# Patient Record
Sex: Female | Born: 1945 | Race: White | Hispanic: No | Marital: Married | Smoking: Former smoker
Health system: Southern US, Community
[De-identification: ages and names within clinical notes are randomized; demographics above are authoritative.]

## PROBLEM LIST (undated history)

## (undated) DIAGNOSIS — I10 Essential (primary) hypertension: Secondary | ICD-10-CM

## (undated) HISTORY — PX: BREAST BIOPSY: SHX20

## (undated) HISTORY — PX: KNEE SURGERY: SHX244

## (undated) HISTORY — DX: Essential (primary) hypertension: I10

---

## 1999-08-31 ENCOUNTER — Emergency Department (HOSPITAL_COMMUNITY): Admission: EM | Admit: 1999-08-31 | Discharge: 1999-08-31 | Payer: Self-pay | Admitting: Emergency Medicine

## 1999-08-31 ENCOUNTER — Encounter: Payer: Self-pay | Admitting: Emergency Medicine

## 1999-09-04 ENCOUNTER — Other Ambulatory Visit: Admission: RE | Admit: 1999-09-04 | Discharge: 1999-09-04 | Payer: Self-pay | Admitting: Gynecology

## 1999-09-04 ENCOUNTER — Encounter (INDEPENDENT_AMBULATORY_CARE_PROVIDER_SITE_OTHER): Payer: Self-pay | Admitting: *Deleted

## 2000-04-21 ENCOUNTER — Encounter: Admission: RE | Admit: 2000-04-21 | Discharge: 2000-04-21 | Payer: Self-pay | Admitting: Gynecology

## 2000-04-21 ENCOUNTER — Encounter: Payer: Self-pay | Admitting: Gynecology

## 2001-05-02 ENCOUNTER — Encounter: Payer: Self-pay | Admitting: Gynecology

## 2001-05-02 ENCOUNTER — Encounter: Admission: RE | Admit: 2001-05-02 | Discharge: 2001-05-02 | Payer: Self-pay | Admitting: Gynecology

## 2001-09-08 ENCOUNTER — Other Ambulatory Visit: Admission: RE | Admit: 2001-09-08 | Discharge: 2001-09-08 | Payer: Self-pay | Admitting: Gynecology

## 2002-06-01 ENCOUNTER — Encounter: Admission: RE | Admit: 2002-06-01 | Discharge: 2002-06-01 | Payer: Self-pay | Admitting: Gynecology

## 2002-06-01 ENCOUNTER — Encounter: Payer: Self-pay | Admitting: Gynecology

## 2002-10-16 ENCOUNTER — Other Ambulatory Visit: Admission: RE | Admit: 2002-10-16 | Discharge: 2002-10-16 | Payer: Self-pay | Admitting: Gynecology

## 2003-06-27 ENCOUNTER — Encounter: Admission: RE | Admit: 2003-06-27 | Discharge: 2003-06-27 | Payer: Self-pay | Admitting: Gynecology

## 2003-06-27 ENCOUNTER — Encounter: Payer: Self-pay | Admitting: Gynecology

## 2003-10-22 ENCOUNTER — Other Ambulatory Visit: Admission: RE | Admit: 2003-10-22 | Discharge: 2003-10-22 | Payer: Self-pay | Admitting: Gynecology

## 2004-06-27 ENCOUNTER — Encounter: Admission: RE | Admit: 2004-06-27 | Discharge: 2004-06-27 | Payer: Self-pay | Admitting: Gynecology

## 2004-10-22 ENCOUNTER — Other Ambulatory Visit: Admission: RE | Admit: 2004-10-22 | Discharge: 2004-10-22 | Payer: Self-pay | Admitting: Gynecology

## 2005-07-28 ENCOUNTER — Encounter: Admission: RE | Admit: 2005-07-28 | Discharge: 2005-07-28 | Payer: Self-pay | Admitting: Gynecology

## 2005-08-07 ENCOUNTER — Encounter: Admission: RE | Admit: 2005-08-07 | Discharge: 2005-08-07 | Payer: Self-pay | Admitting: Gynecology

## 2005-11-06 ENCOUNTER — Other Ambulatory Visit: Admission: RE | Admit: 2005-11-06 | Discharge: 2005-11-06 | Payer: Self-pay | Admitting: Gynecology

## 2006-07-02 ENCOUNTER — Encounter: Admission: RE | Admit: 2006-07-02 | Discharge: 2006-07-02 | Payer: Self-pay

## 2006-08-18 ENCOUNTER — Encounter: Admission: RE | Admit: 2006-08-18 | Discharge: 2006-08-18 | Payer: Self-pay | Admitting: Gynecology

## 2006-08-31 ENCOUNTER — Encounter: Admission: RE | Admit: 2006-08-31 | Discharge: 2006-08-31 | Payer: Self-pay | Admitting: Gynecology

## 2006-11-09 ENCOUNTER — Other Ambulatory Visit: Admission: RE | Admit: 2006-11-09 | Discharge: 2006-11-09 | Payer: Self-pay | Admitting: Gynecology

## 2007-03-01 ENCOUNTER — Encounter: Admission: RE | Admit: 2007-03-01 | Discharge: 2007-03-01 | Payer: Self-pay | Admitting: Gynecology

## 2007-03-07 ENCOUNTER — Encounter: Admission: RE | Admit: 2007-03-07 | Discharge: 2007-03-07 | Payer: Self-pay | Admitting: Gynecology

## 2007-03-07 ENCOUNTER — Encounter (INDEPENDENT_AMBULATORY_CARE_PROVIDER_SITE_OTHER): Payer: Self-pay | Admitting: Specialist

## 2007-04-27 ENCOUNTER — Encounter (INDEPENDENT_AMBULATORY_CARE_PROVIDER_SITE_OTHER): Payer: Self-pay | Admitting: Surgery

## 2007-04-27 ENCOUNTER — Encounter: Admission: RE | Admit: 2007-04-27 | Discharge: 2007-04-27 | Payer: Self-pay | Admitting: Surgery

## 2007-04-27 ENCOUNTER — Ambulatory Visit (HOSPITAL_BASED_OUTPATIENT_CLINIC_OR_DEPARTMENT_OTHER): Admission: RE | Admit: 2007-04-27 | Discharge: 2007-04-27 | Payer: Self-pay | Admitting: Surgery

## 2007-09-07 ENCOUNTER — Encounter: Admission: RE | Admit: 2007-09-07 | Discharge: 2007-09-07 | Payer: Self-pay | Admitting: Gynecology

## 2008-09-07 ENCOUNTER — Encounter: Admission: RE | Admit: 2008-09-07 | Discharge: 2008-09-07 | Payer: Self-pay | Admitting: Gynecology

## 2009-09-17 ENCOUNTER — Ambulatory Visit: Payer: Self-pay | Admitting: Diagnostic Radiology

## 2009-09-17 ENCOUNTER — Ambulatory Visit (HOSPITAL_BASED_OUTPATIENT_CLINIC_OR_DEPARTMENT_OTHER): Admission: RE | Admit: 2009-09-17 | Discharge: 2009-09-17 | Payer: Self-pay | Admitting: Gynecology

## 2010-03-11 ENCOUNTER — Inpatient Hospital Stay (HOSPITAL_COMMUNITY): Admission: RE | Admit: 2010-03-11 | Discharge: 2010-03-14 | Payer: Self-pay | Admitting: Orthopaedic Surgery

## 2010-03-12 ENCOUNTER — Ambulatory Visit: Payer: Self-pay | Admitting: Surgery

## 2010-03-12 ENCOUNTER — Encounter (INDEPENDENT_AMBULATORY_CARE_PROVIDER_SITE_OTHER): Payer: Self-pay | Admitting: Orthopaedic Surgery

## 2010-07-29 ENCOUNTER — Inpatient Hospital Stay (HOSPITAL_COMMUNITY): Admission: RE | Admit: 2010-07-29 | Discharge: 2010-07-31 | Payer: Self-pay | Admitting: Orthopaedic Surgery

## 2010-10-01 ENCOUNTER — Ambulatory Visit (HOSPITAL_BASED_OUTPATIENT_CLINIC_OR_DEPARTMENT_OTHER): Admission: RE | Admit: 2010-10-01 | Discharge: 2010-10-01 | Payer: Self-pay | Admitting: Orthopaedic Surgery

## 2010-10-01 ENCOUNTER — Ambulatory Visit: Payer: Self-pay | Admitting: Diagnostic Radiology

## 2010-10-10 ENCOUNTER — Encounter: Admission: RE | Admit: 2010-10-10 | Discharge: 2010-10-10 | Payer: Self-pay | Admitting: Gynecology

## 2010-12-20 ENCOUNTER — Encounter: Payer: Self-pay | Admitting: Gynecology

## 2010-12-21 ENCOUNTER — Encounter: Payer: Self-pay | Admitting: Gynecology

## 2011-01-21 ENCOUNTER — Other Ambulatory Visit: Payer: Self-pay | Admitting: Gynecology

## 2011-02-12 LAB — BASIC METABOLIC PANEL
BUN: 5 mg/dL — ABNORMAL LOW (ref 6–23)
Calcium: 8.8 mg/dL (ref 8.4–10.5)
GFR calc Af Amer: >60 mL/min (ref >60)
GFR calc non Af Amer: >60 mL/min (ref >60)
Potassium: 3.7 mEq/L (ref 3.5–5.1)
Sodium: 138 mEq/L (ref 135–145)

## 2011-02-12 LAB — CBC
MCH: 31.7 pg (ref 26.0–34.0)
MCHC: 33.4 g/dL (ref 30.0–36.0)
WBC: 9.4 10*3/uL (ref 4.0–10.5)

## 2011-02-13 LAB — PROTIME-INR: INR: 1.04 (ref 0.00–1.49)

## 2011-02-13 LAB — COMPREHENSIVE METABOLIC PANEL
ALT: 21 U/L (ref 0–35)
AST: 25 U/L (ref 0–37)
Albumin: 4.2 g/dL (ref 3.5–5.2)
CO2: 30 mEq/L (ref 19–32)
Calcium: 9.6 mg/dL (ref 8.4–10.5)
Creatinine, Ser: 0.75 mg/dL (ref 0.4–1.2)
GFR calc non Af Amer: >60 mL/min (ref >60)
Glucose, Bld: 91 mg/dL (ref 70–99)
Potassium: 3.8 mEq/L (ref 3.5–5.1)
Sodium: 140 mEq/L (ref 135–145)

## 2011-02-13 LAB — DIFFERENTIAL
Eosinophils Relative: 5 % (ref 0–5)
Lymphocytes Relative: 16 % (ref 12–46)
Lymphs Abs: 1.1 10*3/uL (ref 0.7–4.0)
Monocytes Relative: 10 % (ref 3–12)
Neutrophils Relative %: 68 % (ref 43–77)

## 2011-02-13 LAB — BASIC METABOLIC PANEL
BUN: 5 mg/dL — ABNORMAL LOW (ref 6–23)
CO2: 30 mEq/L (ref 19–32)
Chloride: 103 mEq/L (ref 96–112)
Creatinine, Ser: 0.74 mg/dL (ref 0.4–1.2)
GFR calc non Af Amer: >60 mL/min (ref >60)
Glucose, Bld: 134 mg/dL — ABNORMAL HIGH (ref 70–99)
Potassium: 4.9 mEq/L (ref 3.5–5.1)

## 2011-02-13 LAB — SURGICAL PCR SCREEN
MRSA, PCR: NEGATIVE
Staphylococcus aureus: NEGATIVE

## 2011-02-13 LAB — CBC
HCT: 36.5 % (ref 36.0–46.0)
MCH: 32 pg (ref 26.0–34.0)
MCHC: 34 g/dL (ref 30.0–36.0)
MCV: 94.7 fL (ref 78.0–100.0)
MCV: 96.6 fL (ref 78.0–100.0)
RDW: 13.4 % (ref 11.5–15.5)
WBC: 10.1 10*3/uL (ref 4.0–10.5)

## 2011-02-13 LAB — URINE MICROSCOPIC-ADD ON

## 2011-02-13 LAB — URINALYSIS, ROUTINE W REFLEX MICROSCOPIC
Bilirubin Urine: NEGATIVE
Glucose, UA: NEGATIVE mg/dL
Hgb urine dipstick: NEGATIVE
Ketones, ur: NEGATIVE mg/dL
Urobilinogen, UA: 0.2 mg/dL (ref 0.0–1.0)
pH: 6.5 (ref 5.0–8.0)

## 2011-02-13 LAB — URINE CULTURE: Culture  Setup Time: 201108241839

## 2011-02-13 LAB — TYPE AND SCREEN
ABO/RH(D): O POS
Antibody Screen: NEGATIVE

## 2011-02-13 LAB — APTT: aPTT: 32 seconds (ref 24–37)

## 2011-02-17 LAB — CBC
Hemoglobin: 9.5 g/dL — ABNORMAL LOW (ref 12.0–15.0)
MCHC: 35.1 g/dL (ref 30.0–36.0)
Platelets: 163 10*3/uL (ref 150–400)
RDW: 12.4 % (ref 11.5–15.5)

## 2011-02-17 LAB — PROTIME-INR
INR: 2.17 — ABNORMAL HIGH (ref 0.00–1.49)
Prothrombin Time: 24 seconds — ABNORMAL HIGH (ref 11.6–15.2)

## 2011-02-17 LAB — BASIC METABOLIC PANEL
CO2: 27 mEq/L (ref 19–32)
Calcium: 8.8 mg/dL (ref 8.4–10.5)
Glucose, Bld: 116 mg/dL — ABNORMAL HIGH (ref 70–99)

## 2011-02-18 LAB — CBC
HCT: 30 % — ABNORMAL LOW (ref 36.0–46.0)
HCT: 41.2 % (ref 36.0–46.0)
Hemoglobin: 10.4 g/dL — ABNORMAL LOW (ref 12.0–15.0)
Hemoglobin: 11.9 g/dL — ABNORMAL LOW (ref 12.0–15.0)
MCHC: 35 g/dL (ref 30.0–36.0)
MCV: 101.3 fL — ABNORMAL HIGH (ref 78.0–100.0)
Platelets: 183 10*3/uL (ref 150–400)
RBC: 3.34 MIL/uL — ABNORMAL LOW (ref 3.87–5.11)
RDW: 12.6 % (ref 11.5–15.5)
RDW: 13 % (ref 11.5–15.5)

## 2011-02-18 LAB — FOLATE RBC: RBC Folate: 1004 ng/mL — ABNORMAL HIGH (ref 180–600)

## 2011-02-18 LAB — BASIC METABOLIC PANEL
CO2: 30 mEq/L (ref 19–32)
Chloride: 102 mEq/L (ref 96–112)
GFR calc Af Amer: >60 mL/min (ref >60)
GFR calc non Af Amer: >60 mL/min (ref >60)
Glucose, Bld: 119 mg/dL — ABNORMAL HIGH (ref 70–99)
Potassium: 3.4 mEq/L — ABNORMAL LOW (ref 3.5–5.1)
Sodium: 136 mEq/L (ref 135–145)
Sodium: 138 mEq/L (ref 135–145)

## 2011-02-18 LAB — COMPREHENSIVE METABOLIC PANEL
AST: 25 U/L (ref 0–37)
Albumin: 4.1 g/dL (ref 3.5–5.2)
Alkaline Phosphatase: 53 U/L (ref 39–117)
BUN: 15 mg/dL (ref 6–23)
GFR calc Af Amer: >60 mL/min (ref >60)
Potassium: 4.6 mEq/L (ref 3.5–5.1)
Total Protein: 6.5 g/dL (ref 6.0–8.3)

## 2011-02-18 LAB — URINALYSIS, ROUTINE W REFLEX MICROSCOPIC
Nitrite: NEGATIVE
Specific Gravity, Urine: 1.004 — ABNORMAL LOW (ref 1.005–1.030)
pH: 6.5 (ref 5.0–8.0)

## 2011-02-18 LAB — DIFFERENTIAL
Basophils Relative: 0 % (ref 0–1)
Lymphocytes Relative: 12 % (ref 12–46)
Monocytes Absolute: 0.5 10*3/uL (ref 0.1–1.0)
Monocytes Relative: 7 % (ref 3–12)
Neutro Abs: 6 10*3/uL (ref 1.7–7.7)

## 2011-02-18 LAB — APTT: aPTT: 31 seconds (ref 24–37)

## 2011-02-18 LAB — URINE CULTURE: Colony Count: NO GROWTH

## 2011-02-18 LAB — PROTIME-INR: INR: 1.17 (ref 0.00–1.49)

## 2011-04-14 NOTE — Op Note (Signed)
NAMELIZABETH, Brittany Gill NO.:  [id number]   MEDICAL RECORD NO.:  [id number]          PATIENT TYPE:  AMB   LOCATION:  DSC                          FACILITY:  [facility]   PHYSICIAN:  Currie Paris, M.D.DATE OF BIRTH:  Jan 02, 1946   DATE OF PROCEDURE:  04/27/2007  DATE OF DISCHARGE:                               OPERATIVE REPORT   OFFICE MEDICAL RECORD NUMBER [facility] [phone].   PREOPERATIVE DIAGNOSIS:  Calcifications right breast, upper outer  quadrant.   POSTOPERATIVE DIAGNOSIS:  Calcifications right breast, upper outer  quadrant.   OPERATION:  Needle-guided excision, right breast calcifications.   SURGEON:  Currie Paris, M.D.   ANESTHESIA:  MAC.   CLINICAL HISTORY:  This is a fairly healthy 65 year old lady who recent  had a mammogram showing a couple of areas of calcifications.  One was  amenable for a stereotactic biopsy and turned out to be benign.  Unfortunately, the other could not be stereotactically biopsied and  needle-localized excision was recommended.  The first area had shown  fibrocystic disease with calcifications.   DESCRIPTION OF PROCEDURE:  The patient was seen in the holding area and  she had no further questions.  She and I both marked the right breast as  the operative side.  Her guidewire already been placed and I reviewed  those films showing good positioning with the calcifications just  anterior to the guidewire.  The guidewire entered laterally and tracked  medially.   The patient was taken to the operating room and after satisfactory IV  sedation, the right breast was prepped and draped as a sterile field.  The time-out occurred.   A combination of 1% plain Xylocaine mixed equally with 0.5% Marcaine  with epinephrine was used for a local.  I outlined a curvilinear  incision just medial to the guidewire entry site and anesthetized the  area all around that as well as around the guidewire and into the breast  tissue along the tract  of the guidewire.  I made the incision and  deepened it a little bit with the scalpel until I could identify the  guidewire in the fatty tissue of the subcu.  I was able to then  manipulate it into the wound.  The guidewire was tracking medially so I  grasped the tract of the guidewire with an Allis and using cautery took  an excisional biopsy, staying a little bit more anterior then posterior  and was able to continually with traction take a cylinder of tissue  around the guidewire until I got beyond the tip of the guidewire.  Since  the calcifications were in very close approximation to the guidewire, I  thought we had the calcifications excised.   A spent several minutes making sure everything was dry, placing  additional local for postop pain relief.  Once that had been  accomplished, I went ahead and closed using some 3-0 Vicryl to  reapproximate some of the deeper breast tissue, especially medially, and  then 4-0 Monocryl subcuticular plus Dermabond on the skin.   The patient tolerated the procedure  well.  There were no operative  complications.  All counts were correct.  Dr. Judyann Munson from the breast  center reported that the specimen mammogram showed the calcifications.   This completed the procedure.  She tolerated it well.      Currie Paris, M.D.  Electronically Signed     CJS/MEDQ  D:  04/27/2007  T:  04/27/2007  Job:  [id number]   cc:   Doristine Devoid, M.D.

## 2011-07-27 ENCOUNTER — Other Ambulatory Visit: Payer: Self-pay | Admitting: Gynecology

## 2011-09-09 ENCOUNTER — Other Ambulatory Visit: Payer: Self-pay | Admitting: Gynecology

## 2011-09-09 DIAGNOSIS — Z1231 Encounter for screening mammogram for malignant neoplasm of breast: Secondary | ICD-10-CM

## 2011-10-15 ENCOUNTER — Ambulatory Visit
Admission: RE | Admit: 2011-10-15 | Discharge: 2011-10-15 | Disposition: A | Discharge status: Home / Self Care | Payer: 59 | Source: Ambulatory Visit | Attending: Gynecology | Admitting: Gynecology

## 2011-10-15 DIAGNOSIS — Z1231 Encounter for screening mammogram for malignant neoplasm of breast: Secondary | ICD-10-CM

## 2012-01-27 ENCOUNTER — Other Ambulatory Visit: Payer: Self-pay | Admitting: Gynecology

## 2012-06-29 ENCOUNTER — Other Ambulatory Visit: Payer: Self-pay | Admitting: Gynecology

## 2012-09-05 ENCOUNTER — Other Ambulatory Visit: Payer: Self-pay | Admitting: Gynecology

## 2012-09-05 DIAGNOSIS — Z1231 Encounter for screening mammogram for malignant neoplasm of breast: Secondary | ICD-10-CM

## 2012-10-17 ENCOUNTER — Ambulatory Visit
Admission: RE | Admit: 2012-10-17 | Discharge: 2012-10-17 | Disposition: A | Discharge status: Home / Self Care | Payer: Medicare HMO | Source: Ambulatory Visit | Attending: Gynecology | Admitting: Gynecology

## 2012-10-17 DIAGNOSIS — Z1231 Encounter for screening mammogram for malignant neoplasm of breast: Secondary | ICD-10-CM

## 2013-01-26 ENCOUNTER — Other Ambulatory Visit: Payer: Self-pay | Admitting: Gynecology

## 2013-09-29 ENCOUNTER — Other Ambulatory Visit: Payer: Self-pay

## 2013-09-29 DIAGNOSIS — Z1231 Encounter for screening mammogram for malignant neoplasm of breast: Secondary | ICD-10-CM

## 2013-11-06 ENCOUNTER — Ambulatory Visit: Payer: Medicare HMO

## 2013-11-06 ENCOUNTER — Ambulatory Visit
Admission: RE | Admit: 2013-11-06 | Discharge: 2013-11-06 | Disposition: A | Discharge status: Home / Self Care | Payer: Medicare HMO | Source: Ambulatory Visit

## 2013-11-06 DIAGNOSIS — Z1231 Encounter for screening mammogram for malignant neoplasm of breast: Secondary | ICD-10-CM

## 2014-11-08 ENCOUNTER — Other Ambulatory Visit: Payer: Self-pay

## 2014-11-08 DIAGNOSIS — Z1231 Encounter for screening mammogram for malignant neoplasm of breast: Secondary | ICD-10-CM

## 2014-12-04 ENCOUNTER — Ambulatory Visit: Payer: Medicare HMO

## 2014-12-11 ENCOUNTER — Ambulatory Visit
Admission: RE | Admit: 2014-12-11 | Discharge: 2014-12-11 | Disposition: A | Discharge status: Home / Self Care | Payer: Medicare HMO | Source: Ambulatory Visit

## 2014-12-11 DIAGNOSIS — Z1231 Encounter for screening mammogram for malignant neoplasm of breast: Secondary | ICD-10-CM

## 2015-06-19 ENCOUNTER — Other Ambulatory Visit: Payer: Self-pay | Admitting: Orthopaedic Surgery

## 2015-06-19 ENCOUNTER — Ambulatory Visit
Admission: RE | Admit: 2015-06-19 | Discharge: 2015-06-19 | Disposition: A | Discharge status: Home / Self Care | Payer: Medicare HMO | Source: Ambulatory Visit | Attending: Orthopaedic Surgery | Admitting: Orthopaedic Surgery

## 2015-06-19 ENCOUNTER — Encounter (INDEPENDENT_AMBULATORY_CARE_PROVIDER_SITE_OTHER): Payer: Self-pay

## 2015-06-19 DIAGNOSIS — M79662 Pain in left lower leg: Secondary | ICD-10-CM

## 2015-06-19 DIAGNOSIS — R609 Edema, unspecified: Secondary | ICD-10-CM

## 2015-06-19 DIAGNOSIS — R52 Pain, unspecified: Secondary | ICD-10-CM

## 2015-12-26 ENCOUNTER — Other Ambulatory Visit: Payer: Self-pay

## 2015-12-26 DIAGNOSIS — Z1231 Encounter for screening mammogram for malignant neoplasm of breast: Secondary | ICD-10-CM

## 2016-01-13 ENCOUNTER — Ambulatory Visit
Admission: RE | Admit: 2016-01-13 | Discharge: 2016-01-13 | Disposition: A | Discharge status: Home / Self Care | Payer: Medicare PPO | Source: Ambulatory Visit

## 2016-01-13 DIAGNOSIS — Z1231 Encounter for screening mammogram for malignant neoplasm of breast: Secondary | ICD-10-CM

## 2016-12-25 ENCOUNTER — Other Ambulatory Visit: Payer: Self-pay | Admitting: Gynecology

## 2016-12-25 DIAGNOSIS — Z1231 Encounter for screening mammogram for malignant neoplasm of breast: Secondary | ICD-10-CM

## 2017-01-27 ENCOUNTER — Ambulatory Visit
Admission: RE | Admit: 2017-01-27 | Discharge: 2017-01-27 | Disposition: A | Discharge status: Home / Self Care | Payer: Medicare PPO | Source: Ambulatory Visit | Attending: Gynecology | Admitting: Gynecology

## 2017-01-27 DIAGNOSIS — Z1231 Encounter for screening mammogram for malignant neoplasm of breast: Secondary | ICD-10-CM

## 2017-12-22 ENCOUNTER — Other Ambulatory Visit: Payer: Self-pay | Admitting: Gynecology

## 2017-12-22 DIAGNOSIS — Z1231 Encounter for screening mammogram for malignant neoplasm of breast: Secondary | ICD-10-CM

## 2018-01-07 ENCOUNTER — Ambulatory Visit (INDEPENDENT_AMBULATORY_CARE_PROVIDER_SITE_OTHER): Payer: Medicare PPO | Admitting: Orthopaedic Surgery

## 2018-01-07 ENCOUNTER — Ambulatory Visit (INDEPENDENT_AMBULATORY_CARE_PROVIDER_SITE_OTHER): Payer: Medicare PPO

## 2018-01-07 ENCOUNTER — Encounter (INDEPENDENT_AMBULATORY_CARE_PROVIDER_SITE_OTHER): Payer: Self-pay | Admitting: Orthopaedic Surgery

## 2018-01-07 ENCOUNTER — Encounter (INDEPENDENT_AMBULATORY_CARE_PROVIDER_SITE_OTHER): Payer: Self-pay

## 2018-01-07 VITALS — BP 150/88 | Resp 16 | Ht 67.0 in | Wt 166.0 lb

## 2018-01-07 DIAGNOSIS — M25562 Pain in left knee: Secondary | ICD-10-CM

## 2018-01-07 DIAGNOSIS — G8929 Other chronic pain: Secondary | ICD-10-CM | POA: Diagnosis not present

## 2018-01-07 NOTE — Progress Notes (Signed)
Office Visit Note   Patient: Brittany Gill           Date of Birth: [birth year]           MRN: [id number] Visit Date: 01/07/2018              Requested by: No referring provider defined for this encounter. PCP: Albertina Senegal, MD   Assessment & Plan: Visit Diagnoses:  1. 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 Recent onset of pain after visiting her grandchildren in [place] without obvious injury or trauma. Felt a "pop" in her knee and has had some feeling of instability but not much pain. Films are negative. 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 Exam today is completely benign. I suggested a course of exercises and a spider brace. This was applied and she felt much better. We'll plan to see her back in a month or 6 weeks if no improvement  Follow-Up Instructions: Return if symptoms worsen or fail to improve.   Orders:  Orders Placed This Encounter  Procedures  . XR KNEE 3 VIEW LEFT   No orders of the defined types were placed in this encounter.     Procedures: No procedures performed   Clinical Data: No additional findings.   Subjective: Chief Complaint  Patient presents with  . Left Knee - Pain  . Knee Pain    Left knee pain x 2 weeks, difficulty bending, swelling, pop, no injury, total knee replacement Dr. Cleophas Dunker 2010?, Tramadol doesn't help, not diabetic  Has had some feeling of instability in her left knee since the onset popping several weeks ago .knee has not been swollen and red or ecchymotic. No obvious swelling. No numbness or tingling. Is able to walk without any ambulatory aid at this point and not symptomatic today  HPI  Review of Systems  Constitutional: Positive for activity change.  HENT: Negative for trouble swallowing.   Eyes: Negative for pain.  Respiratory: Positive for chest tightness and shortness of breath.   Cardiovascular: Positive for chest pain.    Gastrointestinal: Negative for constipation.  Endocrine: Negative for cold intolerance.  Genitourinary: Negative for difficulty urinating.  Musculoskeletal: Positive for joint swelling.  Skin: Negative for rash.  Allergic/Immunologic: Positive for food allergies.  Neurological: Positive for weakness and numbness.  Hematological: Does not bruise/bleed easily.  Psychiatric/Behavioral: Negative for sleep disturbance.     Objective: Vital Signs: BP (!) 150/88 (BP Location: Left Arm, Patient Position: Sitting, Cuff Size: Normal)   Resp 16   Ht 5\' 7"  (1.702 m)   Wt 166 lb (75.3 kg)   BMI 26.00 kg/m   Physical Exam  Ortho Exam awake alert and oriented 3. Comfortable sitting. Examination the left knee revealed no evidence of instability there was no opening with varus and valgus stress. About a 2 mm anterior drawer sign. No effusion. Knee was not hot red or swollen. Full extension over 110 of flexion. No popliteal pain. No distal edema. Neurovascular exam intact  Specialty Comments:  No specialty comments available.  Imaging: Xr Knee 3 View Left  Result Date: 01/07/2018 Films of the left total knee replacement and were obtained in 3 projections standing. Excellent glue mantle. Alignment of the components is without complication. No obvious wear of the polyethylene component. No evidence of any obvious problem with the knee replacement or bone around the knee replacement    PMFS History: There  are no active problems to display for this patient.  Past Medical History:  Diagnosis Date  . Hypertension     History reviewed. No pertinent family history.  Past Surgical History:  Procedure Laterality Date  . KNEE SURGERY     Social History   Occupational History  . Not on file  Tobacco Use  . Smoking status: Former Smoker    Packs/day: 0.50    Years: 25.00    Pack years: 12.50    Types: Cigarettes    Last attempt to quit: 01/08/1980    Years since quitting: 38.0  .  Smokeless tobacco: Never Used  Substance and Sexual Activity  . Alcohol use: No    Frequency: Never  . Drug use: No  . Sexual activity: Not on file

## 2018-01-10 ENCOUNTER — Ambulatory Visit (INDEPENDENT_AMBULATORY_CARE_PROVIDER_SITE_OTHER): Payer: Self-pay | Admitting: Orthopaedic Surgery

## 2018-01-31 ENCOUNTER — Ambulatory Visit
Admission: RE | Admit: 2018-01-31 | Discharge: 2018-01-31 | Disposition: A | Discharge status: Home / Self Care | Payer: Medicare PPO | Source: Ambulatory Visit | Attending: Gynecology | Admitting: Gynecology

## 2018-01-31 DIAGNOSIS — Z1231 Encounter for screening mammogram for malignant neoplasm of breast: Secondary | ICD-10-CM

## 2018-10-24 IMAGING — MG DIGITAL SCREENING BILATERAL MAMMOGRAM WITH TOMO AND CAD
8 series · 9 of 24 positions shown · non-contrast
Comparison: Previous exam(s).

CLINICAL DATA: Screening.

EXAM:
DIGITAL SCREENING BILATERAL MAMMOGRAM WITH TOMO AND CAD

[R CC synth-2D]
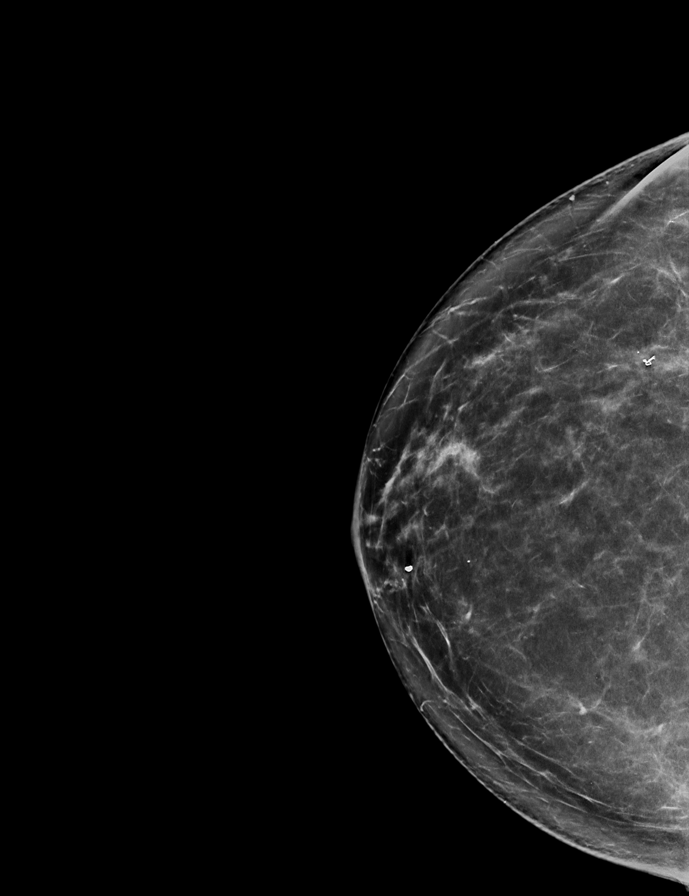

[L CC synth-2D]
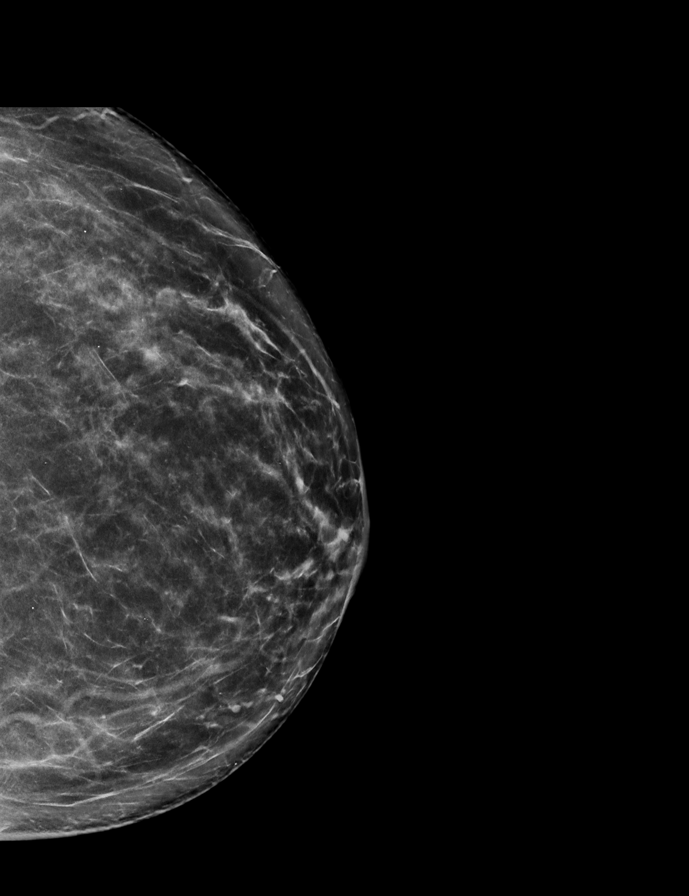

[L MLO synth-2D]
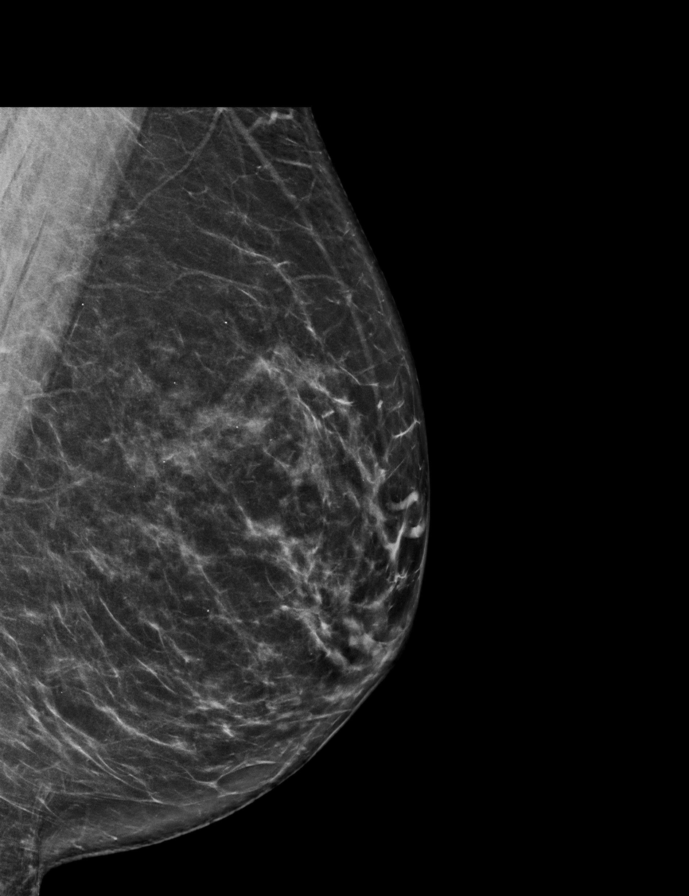

[R MLO synth-2D]
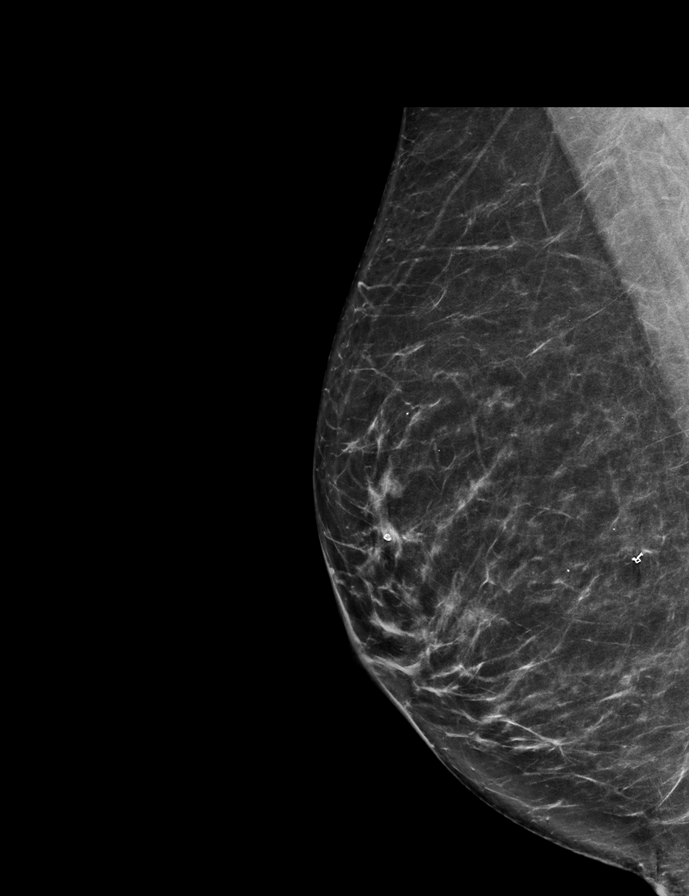

[L CC tomo · 2 of 82 frames shown]
[frame 27/82]
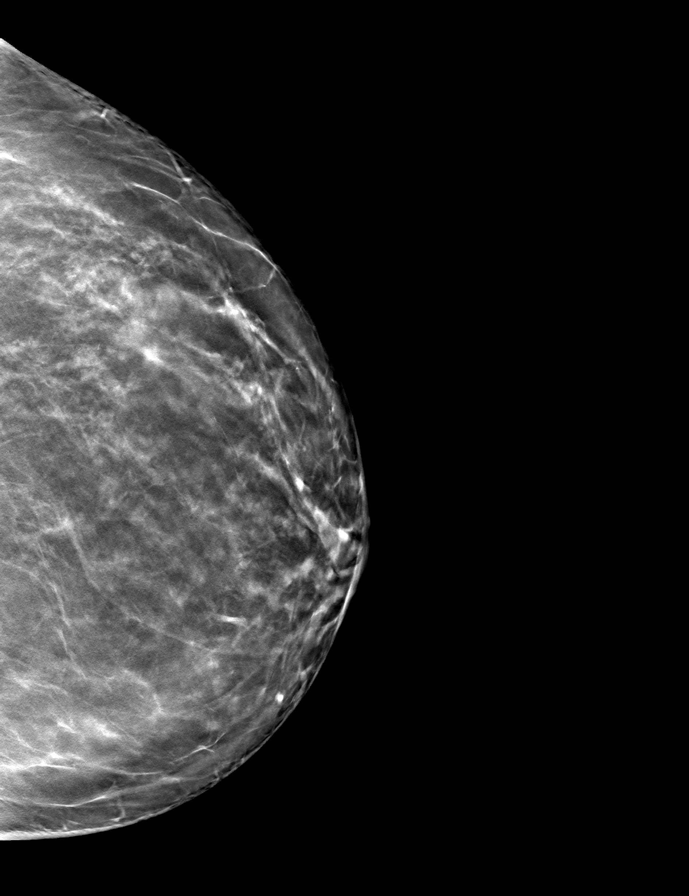
[frame 41/82]
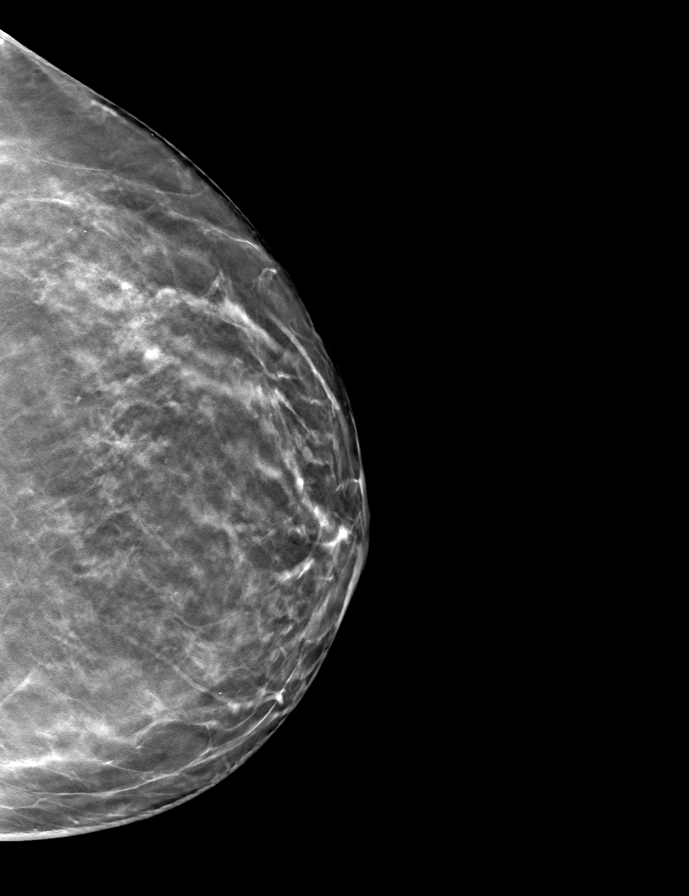

[R CC tomo · tomo slice 43/84.0]
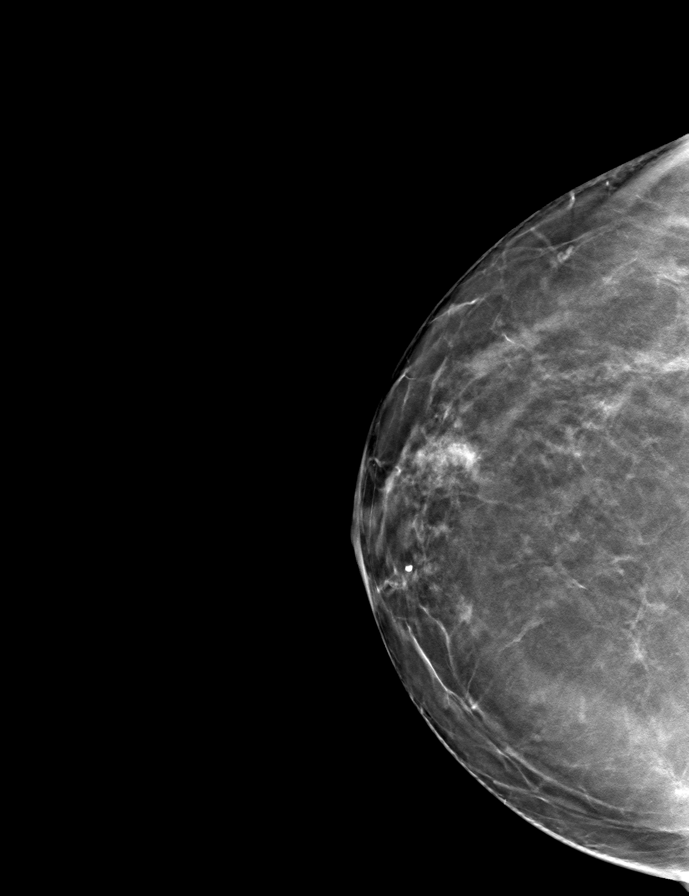

[R MLO tomo · tomo slice 38/75.0]
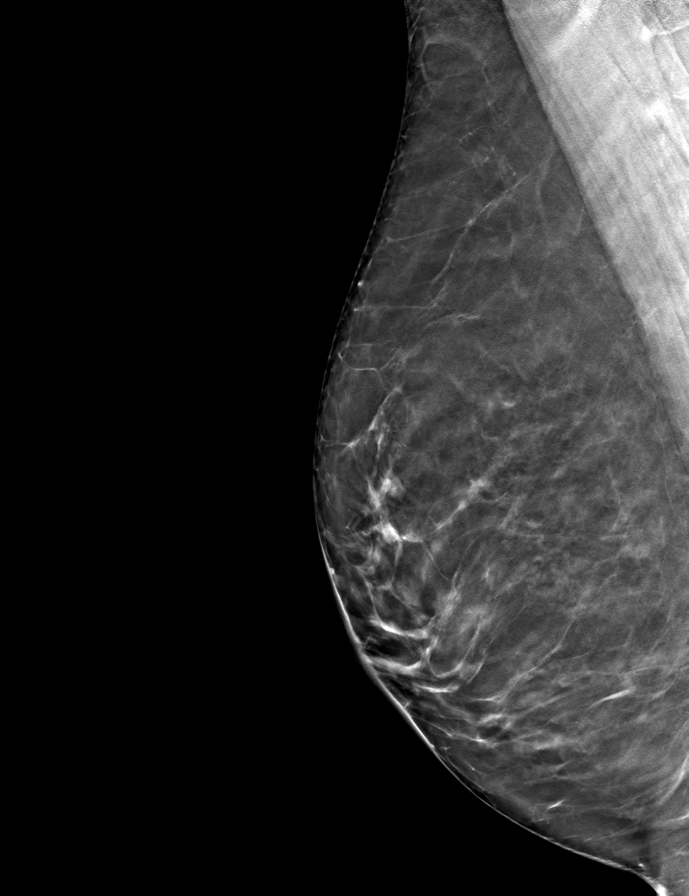

[L MLO tomo · tomo slice 39/77.0]
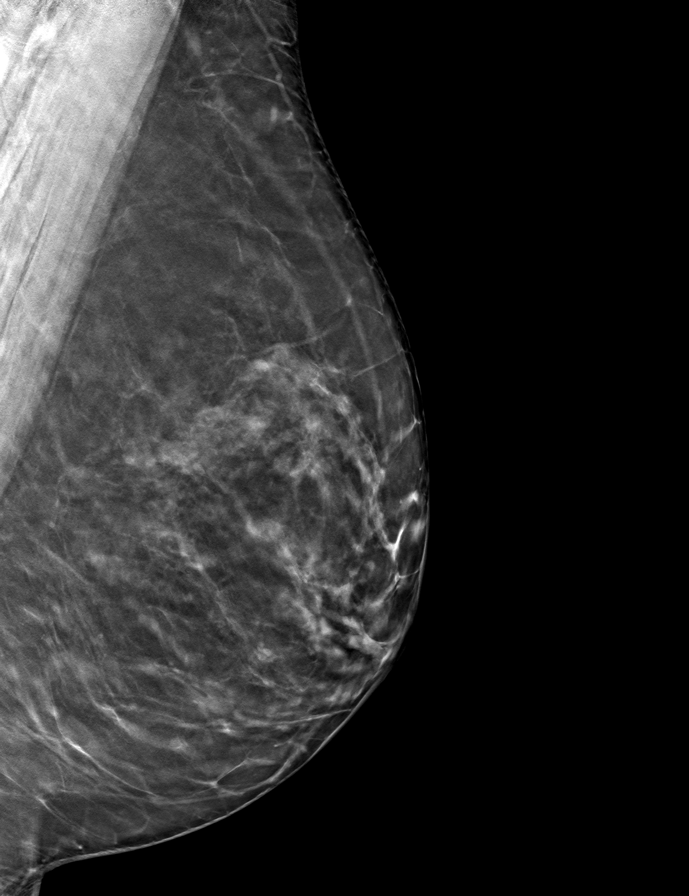

[9 of 24 positions shown; findings below may reference images not displayed]

ACR Breast Density Category b: There are scattered areas of
fibroglandular density.
FINDINGS: There are no findings suspicious for malignancy. Images were
processed with CAD.
IMPRESSION: No mammographic evidence of malignancy. A result letter of this
screening mammogram will be mailed directly to the patient.

RECOMMENDATION:
Screening mammogram in one year. (Code:CN-U-775)

BI-RADS CATEGORY  1: Negative.

## 2019-09-14 ENCOUNTER — Other Ambulatory Visit: Payer: Self-pay

## 2019-09-14 ENCOUNTER — Ambulatory Visit (INDEPENDENT_AMBULATORY_CARE_PROVIDER_SITE_OTHER): Payer: Medicare PPO | Admitting: Orthopaedic Surgery

## 2019-09-14 ENCOUNTER — Ambulatory Visit (INDEPENDENT_AMBULATORY_CARE_PROVIDER_SITE_OTHER): Payer: Medicare PPO

## 2019-09-14 ENCOUNTER — Encounter: Payer: Self-pay | Admitting: Orthopaedic Surgery

## 2019-09-14 VITALS — BP 134/100 | HR 114 | Ht 67.0 in | Wt 167.0 lb

## 2019-09-14 DIAGNOSIS — M25571 Pain in right ankle and joints of right foot: Secondary | ICD-10-CM | POA: Diagnosis not present

## 2019-09-14 NOTE — Progress Notes (Signed)
Office Visit Note   Patient: Brittany Gill           Date of Birth: 26-Sep-1946           MRN: [id number] Visit Date: 09/14/2019              Requested by: Drake Leach, MD [address] [place],  [place] [address] PCP: Drake Leach, MD   Assessment & Plan: Visit Diagnoses:  1. Pain in right ankle and joints of right foot     Plan: Films negative for fracture.  Has evidence of a lateral ankle sprain.  No obvious instability but does guard because of her pain.  Will apply an ASO ankle support and reevaluate in 4 to 6 weeks if no improvement.  Follow-Up Instructions: No follow-ups on file.   Orders:  Orders Placed This Encounter  Procedures  . XR Ankle Complete Right   No orders of the defined types were placed in this encounter.     Procedures: No procedures performed   Clinical Data: No additional findings.   Subjective: Chief Complaint  Patient presents with  . Right Ankle - Pain  Patient presents today for right ankle pain. She tripped on October 7th and could feel it rip. She said that it was initally, but has improved.  Her pain is located laterally and anteriorly. She takes Tramadol and Gabapentin and for her back pain. Her pain in constant, but weightbearing makes it worse.  Has been on Xarelto since January for a single episode of atrial fibrillation.  Wearing an Ace bandage for comfort.  Most of her pain is localized on the lateral aspect of her ankle  HPI  Review of Systems  Constitutional: Negative for fatigue.  HENT: Negative for ear pain.   Eyes: Negative for pain.  Respiratory: Negative for shortness of breath.   Cardiovascular: Negative for leg swelling.  Gastrointestinal: Negative for constipation and diarrhea.  Endocrine: Negative for cold intolerance and heat intolerance.  Genitourinary: Negative for difficulty urinating.  Musculoskeletal: Positive for joint swelling.  Skin: Negative for rash.  Allergic/Immunologic: Positive for food  allergies.  Neurological: Negative for weakness.  Hematological: Does not bruise/bleed easily.  Psychiatric/Behavioral: Negative for sleep disturbance.     Objective: Vital Signs: BP (!) 134/100   Pulse (!) 114   Ht 5\' 7"  (1.702 m)   Wt 167 lb (75.8 kg)   BMI 26.16 kg/m   Physical Exam Constitutional:      Appearance: She is well-developed.  Eyes:     Pupils: Pupils are equal, round, and reactive to light.  Pulmonary:     Effort: Pulmonary effort is normal.  Skin:    General: Skin is warm and dry.  Neurological:     Mental Status: She is alert and oriented to person, place, and time.  Psychiatric:        Behavior: Behavior normal.     Ortho Exam awake alert and oriented x3.  Comfortable sitting.  Examination of right ankle reveals moderate lateral swelling with tenderness over the anterior talofibular ligament and the distal fibula.  No deformity.  Neurologically intact.  Diffuse resolving ecchymosis presumably on the basis of her Xarelto.  No pain medially.  Skin intact  Specialty Comments:  No specialty comments available.  Imaging: No results found.   PMFS History: Patient Active Problem List   Diagnosis Date Noted  . Pain in right ankle and joints of right foot 09/14/2019   Past Medical History:  Diagnosis Date  .  Hypertension     Family History  Problem Relation Age of Onset  . Breast cancer Neg Hx     Past Surgical History:  Procedure Laterality Date  . BREAST BIOPSY Left   . KNEE SURGERY     Social History   Occupational History  . Not on file  Tobacco Use  . Smoking status: Former Smoker    Packs/day: 0.50    Years: 25.00    Pack years: 12.50    Types: Cigarettes    Quit date: 01/08/1980    Years since quitting: 39.7  . Smokeless tobacco: Never Used  Substance and Sexual Activity  . Alcohol use: No    Frequency: Never  . Drug use: No  . Sexual activity: Not on file
